# Patient Record
Sex: Female | Born: 1985 | Race: Black or African American | Hispanic: No | Marital: Married | Smoking: Never smoker
Health system: Southern US, Community
[De-identification: ages and names within clinical notes are randomized; demographics above are authoritative.]

## PROBLEM LIST (undated history)

## (undated) DIAGNOSIS — J36 Peritonsillar abscess: Secondary | ICD-10-CM

## (undated) DIAGNOSIS — D649 Anemia, unspecified: Secondary | ICD-10-CM

## (undated) HISTORY — PX: NO PAST SURGERIES: SHX2092

---

## 2018-06-06 ENCOUNTER — Emergency Department (HOSPITAL_COMMUNITY)

## 2018-06-06 ENCOUNTER — Other Ambulatory Visit: Payer: Self-pay

## 2018-06-06 ENCOUNTER — Observation Stay (HOSPITAL_COMMUNITY)
Admission: EM | Admit: 2018-06-06 | Discharge: 2018-06-07 | Disposition: A | Discharge status: Home / Self Care | Attending: Internal Medicine | Admitting: Internal Medicine

## 2018-06-06 ENCOUNTER — Encounter (HOSPITAL_COMMUNITY): Payer: Self-pay | Admitting: Emergency Medicine

## 2018-06-06 ENCOUNTER — Encounter (HOSPITAL_COMMUNITY): Payer: Self-pay | Admitting: General Practice

## 2018-06-06 ENCOUNTER — Ambulatory Visit (INDEPENDENT_AMBULATORY_CARE_PROVIDER_SITE_OTHER)
Admission: EM | Admit: 2018-06-06 | Discharge: 2018-06-06 | Disposition: A | Discharge status: Home / Self Care | Source: Home / Self Care

## 2018-06-06 DIAGNOSIS — J36 Peritonsillar abscess: Principal | ICD-10-CM

## 2018-06-06 DIAGNOSIS — H9209 Otalgia, unspecified ear: Secondary | ICD-10-CM | POA: Insufficient documentation

## 2018-06-06 DIAGNOSIS — J029 Acute pharyngitis, unspecified: Secondary | ICD-10-CM

## 2018-06-06 DIAGNOSIS — Z23 Encounter for immunization: Secondary | ICD-10-CM | POA: Diagnosis not present

## 2018-06-06 DIAGNOSIS — R0981 Nasal congestion: Secondary | ICD-10-CM | POA: Insufficient documentation

## 2018-06-06 DIAGNOSIS — I889 Nonspecific lymphadenitis, unspecified: Secondary | ICD-10-CM | POA: Diagnosis not present

## 2018-06-06 DIAGNOSIS — D509 Iron deficiency anemia, unspecified: Secondary | ICD-10-CM | POA: Insufficient documentation

## 2018-06-06 DIAGNOSIS — D72829 Elevated white blood cell count, unspecified: Secondary | ICD-10-CM

## 2018-06-06 DIAGNOSIS — R509 Fever, unspecified: Secondary | ICD-10-CM | POA: Diagnosis present

## 2018-06-06 HISTORY — DX: Peritonsillar abscess: J36

## 2018-06-06 HISTORY — DX: Anemia, unspecified: D64.9

## 2018-06-06 LAB — MONONUCLEOSIS SCREEN: Mono Screen: NEGATIVE

## 2018-06-06 LAB — COMPREHENSIVE METABOLIC PANEL
ALK PHOS: 81 U/L (ref 38–126)
ALT: 9 U/L (ref 0–44)
ANION GAP: 10 (ref 5–15)
AST: 17 U/L (ref 15–41)
Albumin: 3.7 g/dL (ref 3.5–5.0)
BUN: 6 mg/dL (ref 6–20)
CALCIUM: 9.3 mg/dL (ref 8.9–10.3)
CO2: 24 mmol/L (ref 22–32)
Chloride: 103 mmol/L (ref 98–111)
Creatinine, Ser: 0.51 mg/dL (ref 0.44–1.00)
GFR calc Af Amer: >60 mL/min (ref >60)
GFR calc non Af Amer: >60 mL/min (ref >60)
GLUCOSE: 123 mg/dL — AB (ref 70–99)
Potassium: 3.8 mmol/L (ref 3.5–5.1)
Sodium: 137 mmol/L (ref 135–145)
TOTAL PROTEIN: 8 g/dL (ref 6.5–8.1)
Total Bilirubin: 0.5 mg/dL (ref 0.3–1.2)

## 2018-06-06 LAB — CBC WITH DIFFERENTIAL/PLATELET
Abs Immature Granulocytes: 0.09 10*3/uL — ABNORMAL HIGH (ref 0.00–0.07)
Basophils Absolute: 0 10*3/uL (ref 0.0–0.1)
Basophils Relative: 0 %
EOS ABS: 0 10*3/uL (ref 0.0–0.5)
Eosinophils Relative: 0 %
HCT: 35.8 % — ABNORMAL LOW (ref 36.0–46.0)
Hemoglobin: 11.1 g/dL — ABNORMAL LOW (ref 12.0–15.0)
Immature Granulocytes: 1 %
Lymphocytes Relative: 9 %
Lymphs Abs: 1.8 10*3/uL (ref 0.7–4.0)
MCH: 20.7 pg — AB (ref 26.0–34.0)
MCHC: 31 g/dL (ref 30.0–36.0)
MCV: 66.7 fL — ABNORMAL LOW (ref 80.0–100.0)
Monocytes Absolute: 1.2 10*3/uL — ABNORMAL HIGH (ref 0.1–1.0)
Monocytes Relative: 7 %
Neutro Abs: 15.7 10*3/uL — ABNORMAL HIGH (ref 1.7–7.7)
Neutrophils Relative %: 83 %
Platelets: 221 10*3/uL (ref 150–400)
RBC: 5.37 MIL/uL — ABNORMAL HIGH (ref 3.87–5.11)
RDW: 15.8 % — ABNORMAL HIGH (ref 11.5–15.5)
WBC: 18.9 10*3/uL — ABNORMAL HIGH (ref 4.0–10.5)
nRBC: 0 % (ref 0.0–0.2)

## 2018-06-06 LAB — I-STAT BETA HCG BLOOD, ED (MC, WL, AP ONLY)

## 2018-06-06 LAB — POCT RAPID STREP A: Streptococcus, Group A Screen (Direct): NEGATIVE

## 2018-06-06 LAB — I-STAT CG4 LACTIC ACID, ED
Lactic Acid, Venous: 0.88 mmol/L (ref 0.5–1.9)
Lactic Acid, Venous: 0.71 mmol/L (ref 0.5–1.9)

## 2018-06-06 MED ORDER — KETOROLAC TROMETHAMINE 30 MG/ML IJ SOLN
30.0000 mg | Freq: Once | INTRAMUSCULAR | Status: AC
  Administered 2018-06-06: 30 mg via INTRAVENOUS
  Filled 2018-06-06: qty 1

## 2018-06-06 MED ORDER — CLINDAMYCIN PHOSPHATE 600 MG/50ML IV SOLN
600.0000 mg | Freq: Once | INTRAVENOUS | Status: AC
  Administered 2018-06-06: 600 mg via INTRAVENOUS
  Filled 2018-06-06: qty 50

## 2018-06-06 MED ORDER — ENOXAPARIN SODIUM 40 MG/0.4ML ~~LOC~~ SOLN
40.0000 mg | SUBCUTANEOUS | Status: DC
  Administered 2018-06-06: 40 mg via SUBCUTANEOUS
  Filled 2018-06-06: qty 0.4

## 2018-06-06 MED ORDER — ACETAMINOPHEN 160 MG/5ML PO SOLN
ORAL | Status: AC
  Filled 2018-06-06: qty 20.3

## 2018-06-06 MED ORDER — DEXAMETHASONE SODIUM PHOSPHATE 10 MG/ML IJ SOLN
10.0000 mg | Freq: Three times a day (TID) | INTRAMUSCULAR | Status: AC
  Administered 2018-06-06 – 2018-06-07 (×2): 10 mg via INTRAVENOUS
  Filled 2018-06-06 (×2): qty 1

## 2018-06-06 MED ORDER — IOHEXOL 300 MG/ML  SOLN
75.0000 mL | Freq: Once | INTRAMUSCULAR | Status: AC | PRN
  Administered 2018-06-06: 75 mL via INTRAVENOUS

## 2018-06-06 MED ORDER — ACETAMINOPHEN 325 MG PO TABS
650.0000 mg | ORAL_TABLET | Freq: Once | ORAL | Status: AC
  Administered 2018-06-06: 650 mg via ORAL

## 2018-06-06 MED ORDER — CLINDAMYCIN PHOSPHATE 600 MG/50ML IV SOLN
600.0000 mg | Freq: Three times a day (TID) | INTRAVENOUS | Status: DC
  Administered 2018-06-06 – 2018-06-07 (×2): 600 mg via INTRAVENOUS
  Filled 2018-06-06 (×3): qty 50

## 2018-06-06 MED ORDER — ACETAMINOPHEN 325 MG PO TABS: 650.0000 mg | ORAL_TABLET | Freq: Four times a day (QID) | ORAL | Status: DC | PRN

## 2018-06-06 MED ORDER — SODIUM CHLORIDE 0.9 % IV SOLN
Freq: Once | INTRAVENOUS | Status: AC
  Administered 2018-06-06: 21:00:00 via INTRAVENOUS

## 2018-06-06 MED ORDER — INFLUENZA VAC SPLIT QUAD 0.5 ML IM SUSY
0.5000 mL | PREFILLED_SYRINGE | INTRAMUSCULAR | Status: AC
  Administered 2018-06-07: 0.5 mL via INTRAMUSCULAR
  Filled 2018-06-06: qty 0.5

## 2018-06-06 MED ORDER — DEXAMETHASONE SODIUM PHOSPHATE 10 MG/ML IJ SOLN
10.0000 mg | Freq: Once | INTRAMUSCULAR | Status: AC
  Administered 2018-06-06: 10 mg via INTRAVENOUS
  Filled 2018-06-06: qty 1

## 2018-06-06 MED ORDER — SODIUM CHLORIDE 0.9% FLUSH
3.0000 mL | Freq: Two times a day (BID) | INTRAVENOUS | Status: DC
  Administered 2018-06-06: 3 mL via INTRAVENOUS

## 2018-06-06 MED ORDER — ACETAMINOPHEN 650 MG RE SUPP: 650.0000 mg | Freq: Four times a day (QID) | RECTAL | Status: DC | PRN

## 2018-06-06 MED ORDER — HYDROCODONE-ACETAMINOPHEN 7.5-325 MG/15ML PO SOLN
10.0000 mL | Freq: Once | ORAL | Status: AC
  Administered 2018-06-06: 10 mL via ORAL
  Filled 2018-06-06: qty 15

## 2018-06-06 MED ORDER — SODIUM CHLORIDE 0.9 % IV BOLUS
1000.0000 mL | Freq: Once | INTRAVENOUS | Status: AC
  Administered 2018-06-06: 1000 mL via INTRAVENOUS

## 2018-06-06 NOTE — ED Notes (Signed)
Patient transported to CT 

## 2018-06-06 NOTE — Discharge Instructions (Addendum)
Please go to the ER for further evaluation and rule out peritonsillar abscess.

## 2018-06-06 NOTE — H&P (Signed)
Date: 06/06/2018               Patient Name:  Colleen Shah MRN: [id number]  DOB: 04-01-1986 Age / Sex: 33 y.o., female   PCP: Patient, No Pcp Per         Medical Service: Internal Medicine Teaching Service         Attending Physician: Dr. Gust RungHoffman, Erik C, DO    First Contact: Dr. Chesley MiresBloomfield Pager: [phone]  Second Contact: Dr. Evelene CroonSantos  Pager: [phone]       After Hours (After 5p/  First Contact Pager: [phone]  weekends / holidays): Second Contact Pager: [phone]   Chief Complaint: sore throat   History of Present Illness: Colleen Shah is a 33 y/o otherwise healthy female who presents with 1 week history of sore throat. Also complains of associated fevers, chills, body aches, cough, nasal congestion and ear pain. She began taking OTC cold remedies without much relief. Her sore throat has progressively gotten worse since onset to the point that it has become painful to talk or swallow solid foods. She presented to urgent care today. Rapid strep test was negative, but she was subsequently to ED due to concern for peritonsillar abscess. She denies headaches, shortness of breath, chest pain, abdominal pain, n/v, diarrhea, urinary symptoms or rashes.   Meds: no home meds   Allergies: no known drug allergy   Family History: positive for DM, HTN   Social History: she does not work; no tobacco, EtOH or illicit drug use  Review of Systems: A complete ROS was negative except as per HPI.   Physical Exam: Blood pressure 112/72, pulse 99, temperature 98.3 F (36.8 C), temperature source Oral, resp. rate (!) 23, height 5\' 8"  (1.727 m), weight 81.6 kg, last menstrual period 05/23/2018, SpO2 95 %. General: awake, alert, pleasant female. Non-toxic appearing. NAD HEENT: [facility]/AT; conjunctiva normal; not able to visualize posterior oropharynx Neck: supple; cervical LAD that is TTP  CV: Tachycardic with regular rhythm Pulm: normal respiratory effort; lungs CTA bilaterally Abd: BS+; abdomen is  soft, non-tender, non-distended Skin: warm and diaphoretic Neuro: A&Ox3; answers questions and follows commands appropriately; no focal deficits   EKG: personally reviewed my interpretation is sinus tachycardia; no ST segment changes or T wave inversions    CT neck: tonsillitis with peritonsillar abscesses measuring 9 mm on right and 8 mm on left   Assessment & Plan by Problem: Active Problems:   Peritonsillar abscess  Colleen Shah is a 33 y/o otherwise healthy female presenting with 1 week of progressively worsening sore throat with associated fevers, chills, and body aches. On presentation, she was febrile to 102.7 and tachycardic. Work-up revealed negative strep, leukocytosis of 18.9, microcytic anemia with hgb of 11. CMP and lactic acid were normal. CT neck confirmed bilateral peritonsillar abscesses measuring 8 and 9 mm. She was started on Clindamycin.   1. Bilateral peritonsillar abscesses -  EDP consulted ENT who recommended continuing clindamycin, as well as treating with Decadron 10 mg IV q 8 for 3 doses. Recommended outpatient follow-up, as the lesions are not drainable.  - patient is hemodynamically stable without signs of airway compromise  - pain significantly improved with Toradol and hydrocodone-acetaminophen solution in the ED  - mono screen is pending   2. Microcytic anemia - obtaining iron studies   Diet: soft DVT ppx: Lovenox CODE: FULL   Dispo: Admit patient to Observation with expected length of stay less than 2 midnights.  Signed: Lenward ChancellorBloomfield, Natasha Paulson D, DO  06/06/2018, 6:27 PM  Pager: [phone]

## 2018-06-06 NOTE — ED Notes (Signed)
Admitting MD at bedside.

## 2018-06-06 NOTE — ED Triage Notes (Signed)
One week history of sore throat.

## 2018-06-06 NOTE — ED Provider Notes (Signed)
[facility]    CSN: [id number] Arrival date & time: 06/06/18  6433     History   Chief Complaint Chief Complaint  Patient presents with  . Sore Throat    HPI Colleen Shah is a 33 y.o. female.   Patient is a 33 year old female presents with sore throat for approximately 1 week.  The sore throat has  severely worsened since  last night.  The pain is worse on the left side with swelling.  She is having a lot of trouble swallowing and having high fevers.  She is also having some associated body aches and chills.  She is also having some left ear pain and lymphadenopathy.  Denies any cough, congestion.   ROS per HPI    Sore Throat     History reviewed. No pertinent past medical history.  There are no active problems to display for this patient.   History reviewed. No pertinent surgical history.  OB History   No obstetric history on file.      Home Medications    Prior to Admission medications   Not on File    Family History History reviewed. No pertinent family history.  Social History Social History   Tobacco Use  . Smoking status: Never Smoker  Substance Use Topics  . Alcohol use: Yes  . Drug use: Never     Allergies   Patient has no known allergies.   Review of Systems Review of Systems   Physical Exam Triage Vital Signs ED Triage Vitals  Enc Vitals Group     BP 06/06/18 0927 128/88     Pulse Rate 06/06/18 0927 (!) 121     Resp 06/06/18 0927 18     Temp 06/06/18 0927 (!) 102.7 F (39.3 C)     Temp Source 06/06/18 0927 Oral     SpO2 06/06/18 0927 97 %     Weight --      Height --      Head Circumference --      Peak Flow --      Pain Score 06/06/18 0932 10     Pain Loc --      Pain Edu? --      Excl. in GC? --    No data found.  Updated Vital Signs BP 128/88 (BP Location: Left Arm)   Pulse (!) 121   Temp (!) 102.7 F (39.3 C) (Oral)   Resp 18   LMP 05/23/2018   SpO2 97%   Visual Acuity Right Eye Distance:     Left Eye Distance:   Bilateral Distance:    Right Eye Near:   Left Eye Near:    Bilateral Near:     Physical Exam Vitals signs and nursing note reviewed.  Constitutional:      Appearance: She is ill-appearing.  HENT:     Head: Normocephalic and atraumatic.     Right Ear: Tympanic membrane and ear canal normal.     Left Ear: Tympanic membrane and ear canal normal.     Nose: No congestion or rhinorrhea.     Mouth/Throat:     Pharynx: Pharyngeal swelling present.     Comments: Uvula appears to be slightly deviated Left-sided tonsillar swelling Left cervical adenopathy with tenderness to palpation of left neck area. Muffled voice Very hard to get patient to open her mouth enough to get a full view of the oropharynx.  Eyes:     Conjunctiva/sclera: Conjunctivae normal.  Neck:  Musculoskeletal: Normal range of motion.  Pulmonary:     Effort: Pulmonary effort is normal.  Lymphadenopathy:     Cervical: Cervical adenopathy present.  Skin:    General: Skin is warm.  Neurological:     Mental Status: She is alert.  Psychiatric:        Mood and Affect: Mood normal.      UC Treatments / Results  Labs (all labs ordered are listed, but only abnormal results are displayed) Labs Reviewed  CULTURE, GROUP A STREP [facility])  POCT RAPID STREP A    EKG None  Radiology No results found.  Procedures Procedures (including critical care time)  Medications Ordered in UC Medications  acetaminophen (TYLENOL) tablet 650 mg (650 mg Oral Given 06/06/18 0941)    Initial Impression / Assessment and Plan / UC Course  I have reviewed the triage vital signs and the nursing notes.  Pertinent labs & imaging results that were available during my care of the patient were reviewed by me and considered in my medical decision making (see chart for details).     Sending patient to the ER to rule out peritonsillar abscess Final Clinical Impressions(s) / UC Diagnoses   Final diagnoses:   Sore throat     Discharge Instructions     Please go to the ER for further evaluation and rule out peritonsillar abscess.    ED Prescriptions    None     Controlled Substance Prescriptions [place] Controlled Substance Registry consulted? Not Applicable   Janace Aris, NP 06/06/18 1031

## 2018-06-06 NOTE — Discharge Instructions (Addendum)
Please read and follow all provided instructions.  Your diagnoses today include:  1. Peritonsillar abscess   2. Leukocytosis, unspecified type     Tests performed today include: Strep test: was negative for strep throat Strep culture: you will be notified if this comes back positive. You are treated with antibiotics to cover strep regardless due to  Vital signs. See below for your results today.   Medications prescribed:    Home care instructions:  Please read the educational materials provided and follow any instructions contained in this packet.  Follow-up instructions: Please follow-up with your primary care provider as needed for further evaluation of your symptoms.  Return instructions:  Please return to the Emergency Department if you experience worsening symptoms.  Return if you have worsening problems swallowing, your neck becomes swollen, you cannot swallow your saliva or your voice becomes muffled.  Return with high persistent fever, persistent vomiting, or if you have trouble breathing.  Please return if you have any other emergent concerns.  Additional Information:  Your vital signs today were: BP 111/80 (BP Location: Right Arm)    Pulse (!) 101    Temp 99.6 F (37.6 C) (Oral)    Resp 20    LMP 05/23/2018    SpO2 99%  If your blood pressure (BP) was elevated above 135/85 this visit, please have this repeated by your doctor within one month. --------------

## 2018-06-06 NOTE — ED Provider Notes (Signed)
[facility] EMERGENCY DEPARTMENT Provider Note   CSN: [id number] Arrival date & time: 06/06/18  1032     History   Chief Complaint Chief Complaint  Patient presents with  . Sore Throat    HPI Colleen Shah is a 33 y.o. female.  HPI  Patient is a 33 year old female with no significant past medical history presenting for sore throat and sensation of throat swelling.  She presents from urgent care who recommended ED evaluation for possible peritonsillar abscess.  Patient reports that she has had a sore throat for approximately 1 week, however began to worsen for the past couple days.  She reports that she believes she has a history of a peritonsillar abscess on the same side, however it spontaneously drained after her mother "massaged" it.  She reports she began feeling febrile yesterday.  She reports she has had difficulty keeping down p.o., and was snoring last night per her husband, but otherwise denies difficulty breathing.  She does report muffled voice.  No past medical history on file.  There are no active problems to display for this patient.   No past surgical history on file.   OB History   No obstetric history on file.      Home Medications    Prior to Admission medications   Not on File    Family History No family history on file.  Social History Social History   Tobacco Use  . Smoking status: Never Smoker  Substance Use Topics  . Alcohol use: Yes  . Drug use: Never     Allergies   Patient has no known allergies.   Review of Systems Review of Systems  Constitutional: Positive for chills and fever.  HENT: Positive for sore throat, trouble swallowing and voice change. Negative for congestion.   Eyes: Negative for visual disturbance.  Respiratory: Negative for cough, chest tightness, shortness of breath, wheezing and stridor.   Cardiovascular: Negative for chest pain.  Gastrointestinal: Negative for abdominal pain, nausea and  vomiting.  Genitourinary: Negative for dysuria and flank pain.  Musculoskeletal: Negative for back pain and myalgias.  Skin: Negative for rash.  Neurological: Negative for dizziness, syncope, light-headedness and headaches.     Physical Exam Updated Vital Signs BP 121/76   Pulse (!) 101   Temp 99.6 F (37.6 C) (Oral)   Resp 16   LMP 05/23/2018   SpO2 99%   Physical Exam Vitals signs and nursing note reviewed.  Constitutional:      General: She is not in acute distress.    Appearance: She is well-developed.  HENT:     Head: Normocephalic and atraumatic.     Mouth/Throat:     Mouth: Mucous membranes are moist.     Tonsils: Tonsillar abscess present. Swelling: 3+ on the right. 3+ on the left.     Comments: Uvular deviation to the right.  Eyes:     Conjunctiva/sclera: Conjunctivae normal.     Pupils: Pupils are equal, round, and reactive to light.  Neck:     Musculoskeletal: Normal range of motion and neck supple.     Comments: Left sided cervical adenopathy.  Cardiovascular:     Rate and Rhythm: Regular rhythm. Tachycardia present.     Heart sounds: S1 normal and S2 normal. No murmur.  Pulmonary:     Effort: Pulmonary effort is normal.     Breath sounds: Normal breath sounds. No wheezing or rales.  Abdominal:     General: There is no distension.  Palpations: Abdomen is soft.     Tenderness: There is no abdominal tenderness. There is no guarding.  Musculoskeletal: Normal range of motion.        General: No deformity.  Lymphadenopathy:     Cervical: Cervical adenopathy present.  Skin:    General: Skin is warm and dry.     Findings: No erythema or rash.  Neurological:     Mental Status: She is alert.     Comments: Cranial nerves grossly intact. Patient moves extremities symmetrically and with good coordination.  Psychiatric:        Behavior: Behavior normal.        Thought Content: Thought content normal.        Judgment: Judgment normal.      ED  Treatments / Results  Labs (all labs ordered are listed, but only abnormal results are displayed) Labs Reviewed  COMPREHENSIVE METABOLIC PANEL - Abnormal; Notable for the following components:      Result Value   Glucose, Bld 123 (*)    All other components within normal limits  CBC WITH DIFFERENTIAL/PLATELET - Abnormal; Notable for the following components:   WBC 18.9 (*)    RBC 5.37 (*)    Hemoglobin 11.1 (*)    HCT 35.8 (*)    MCV 66.7 (*)    MCH 20.7 (*)    RDW 15.8 (*)    Neutro Abs 15.7 (*)    Monocytes Absolute 1.2 (*)    Abs Immature Granulocytes 0.09 (*)    All other components within normal limits  CULTURE, BLOOD (ROUTINE X 2)  CULTURE, BLOOD (ROUTINE X 2)  MONONUCLEOSIS SCREEN  I-STAT CG4 LACTIC ACID, ED  I-STAT BETA HCG BLOOD, ED ([facility], [facility], [facility] ONLY)  I-STAT CG4 LACTIC ACID, ED    EKG EKG Interpretation  Date/Time:  Wednesday June 06 2018 10:51:15 EST Ventricular Rate:  124 PR Interval:  142 QRS Duration: 72 QT Interval:  312 QTC Calculation: 448 R Axis:   85 Text Interpretation:  Sinus tachycardia Otherwise normal ECG No old tracing to compare Confirmed by Shaune PollackIsaacs, Cameron [phone]([id number]) on 06/06/2018 4:28:41 PM   Radiology Ct Soft Tissue Neck W Contrast  Result Date: 06/06/2018 CLINICAL DATA:  Sore throat and anterior neck pain with fever EXAM: CT NECK WITH CONTRAST TECHNIQUE: Multidetector CT imaging of the neck was performed using the standard protocol following the bolus administration of intravenous contrast. CONTRAST:  75mL OMNIPAQUE IOHEXOL 300 MG/ML  SOLN COMPARISON:  None. FINDINGS: Pharynx and larynx: Thickened tonsils with bilateral adenoid rim enhancing collections measuring 9 mm on the right and 8 mm on the left. There is a prominent degree of retropharyngeal submucosal edema that tracks into the supraglottic larynx. There is retropharyngeal low-density that has some mass effect on the posterior pharyngeal wall and is somewhat discrete. No rim enhancing  retropharyngeal collection. Salivary glands: No inflammation, mass, or stone. Thyroid: Normal. Lymph nodes: Enlarged bilateral jugular lymph nodes without cavitation. Vascular: Negative Limited intracranial: Negative Visualized orbits: Negative Mastoids and visualized paranasal sinuses: Clear Skeleton: No evidence spinal infection. Upper chest: Negative IMPRESSION: 1. Tonsillitis with peritonsillar abscesses measuring 9 mm on the right and 8 mm on the left. Associated submucosal edema continues into the supraglottic larynx. Retropharyngeal edema versus phlegmon. 2. Cervical adenitis Electronically Signed   By: Marnee SpringJonathon  Watts M.D.   On: 06/06/2018 15:30    Procedures Procedures (including critical care time)  Medications Ordered in ED Medications  sodium chloride 0.9 % bolus 1,000 mL (0 mLs  Intravenous Stopped 06/06/18 1412)  ketorolac (TORADOL) 30 MG/ML injection 30 mg (30 mg Intravenous Given 06/06/18 1316)  clindamycin (CLEOCIN) IVPB 600 mg (0 mg Intravenous Stopped 06/06/18 1411)  dexamethasone (DECADRON) injection 10 mg (10 mg Intravenous Given 06/06/18 1316)  HYDROcodone-acetaminophen (HYCET) 7.5-325 mg/15 ml solution 10 mL (10 mLs Oral Given 06/06/18 1434)     Initial Impression / Assessment and Plan / ED Course  I have reviewed the triage vital signs and the nursing notes.  Pertinent labs & imaging results that were available during my care of the patient were reviewed by me and considered in my medical decision making (see chart for details).  Clinical Course as of Jun 06 1817  Wed Jun 06, 2018  1137 WBC(!): 18.9 [AM]  1137 Lactic Acid, Venous: 0.88 [AM]  1413 Hcg negative. Not crossing over.   I-Stat Beta hCG blood, ED ([facility], [facility], [facility] only) [AM]  1609 Pt with bilateral PTAs. Will consult ENT.    [AM]  1611 Spoke with Dr. Doran HeaterMarcellino of ENT who states that patient's PTA sizes are not drainable at this time.  Appreciate her involvement in the care of this patient.    [AM]  1642 Improving.    Pulse Rate: 99 [AM]  1734 Dr. Doran HeaterMarcellino recommends decadron 10 mg every 8 hours for 3 total doses. Recommends continuing Clindamycin.    [AM]    Clinical Course User Index [AM] Elisha PonderMurray, Alyssa B, PA-C    Patient is critically ill and requiring a higher level of care. Sepsis suspected. Code sepsis called. Patient meeting SIRS criteria based on fever, tachycardia, and leukocytosis. See vitals below. Suspected source of infection tonsillar based on exam. Lactic acid normal.   CT scan demonstrating bilateral peritonsillar abscesses, right 9 mm and left 8 mm.  Patient also with possible early developing phlegmon.  Patient with retropharyngeal edema.  Skin was discussed with Dr. Doran HeaterMarcellino of ENT.  She recommends if patient is admitted, continue clindamycin, Decadron 10 mg IV every 8 hours x3 doses, and reconsultation of ENT if any further concerns arise or patient has airway concerns.  Otherwise, patient to follow-up as outpatient, as these lesions are not drainable.  Recommends considering mononucleosis screen. Appreciate her vomit in the care of this patient.  Vitals:   06/06/18 1500 06/06/18 1515 06/06/18 1530 06/06/18 1628  BP: 122/78 113/79 111/80 109/80  Pulse: (!) 103 (!) 105 (!) 101 99  Resp: (!) 24 (!) 21 20 18   Temp:    98.3 F (36.8 C)  TempSrc:    Oral  SpO2: 100% 98% 99% 97%     Broad spectrum antibiotics initiated based on suspected source of infection. No hypotension or lactate elevation requiring 30 cc/kilo bolus.   Sepsis - Repeat Assessment  Performed at:    4:47 PM  Vitals     Blood pressure 109/80, pulse 99, temperature 98.3 F (36.8 C), temperature source Oral, resp. rate 18, last menstrual period 05/23/2018, SpO2 97 %.  Heart:     Regular rate and rhythm  Lungs:    CTA  Capillary Refill:   <2 sec  Peripheral Pulse:   Radial pulse palpable  Skin:     Normal Color   Spoke with Dr. Evelene CroonSantos of internal medicine who has decided to admit patient. Appreciate her  involvement in the care of this patient.   Final Clinical Impressions(s) / ED Diagnoses   Final diagnoses:  Peritonsillar abscess  Leukocytosis, unspecified type    ED Discharge Orders  None       Delia Chimes 06/06/18 1821    Shaune Pollack, MD 06/07/18 [phone]

## 2018-06-06 NOTE — ED Triage Notes (Signed)
Pt here from UC  [facility] possible [facility]il abscess[facility]

## 2018-06-06 NOTE — ED Notes (Signed)
  Attempted to call report.  RN will call me back. 

## 2018-06-07 ENCOUNTER — Other Ambulatory Visit: Payer: Self-pay | Admitting: Internal Medicine

## 2018-06-07 LAB — CBC
HCT: 33.3 % — ABNORMAL LOW (ref 36.0–46.0)
Hemoglobin: 10.3 g/dL — ABNORMAL LOW (ref 12.0–15.0)
MCH: 20.5 pg — ABNORMAL LOW (ref 26.0–34.0)
MCHC: 30.9 g/dL (ref 30.0–36.0)
MCV: 66.3 fL — ABNORMAL LOW (ref 80.0–100.0)
Platelets: 207 10*3/uL (ref 150–400)
RBC: 5.02 MIL/uL (ref 3.87–5.11)
RDW: 15.7 % — ABNORMAL HIGH (ref 11.5–15.5)
WBC: 13.6 10*3/uL — ABNORMAL HIGH (ref 4.0–10.5)
nRBC: 0 % (ref 0.0–0.2)

## 2018-06-07 LAB — FERRITIN: Ferritin: 165 ng/mL (ref 11–307)

## 2018-06-07 LAB — IRON AND TIBC
Iron: 26 ug/dL — ABNORMAL LOW (ref 28–170)
Saturation Ratios: 13 % (ref 10.4–31.8)
TIBC: 202 ug/dL — ABNORMAL LOW (ref 250–450)
UIBC: 176 ug/dL

## 2018-06-07 LAB — HIV ANTIBODY (ROUTINE TESTING W REFLEX): HIV Screen 4th Generation wRfx: NONREACTIVE

## 2018-06-07 MED ORDER — CLINDAMYCIN HCL 300 MG PO CAPS: 300.0000 mg | ORAL_CAPSULE | Freq: Four times a day (QID) | ORAL | 0 refills | Status: AC

## 2018-06-07 MED ORDER — FERROUS SULFATE 325 (65 FE) MG PO TABS: 325.0000 mg | ORAL_TABLET | Freq: Every day | ORAL | 3 refills | Status: DC

## 2018-06-07 MED ORDER — CLINDAMYCIN HCL 300 MG PO CAPS: 300.0000 mg | ORAL_CAPSULE | Freq: Four times a day (QID) | ORAL | Status: DC

## 2018-06-07 NOTE — Progress Notes (Signed)
Discharge instructions reviewed with patient and patient's husband. Able to answer teach back questions. Verbalizes understanding. Plan to pick up prescription from pharmacy.Denies any additional questions. Printed AVS given to patient. Patient discharged to home Accompanied by husband.

## 2018-06-07 NOTE — Progress Notes (Signed)
   Subjective: Colleen Shah was seen and evaluated at bedside on morning rounds. No acute events overnight. She is feeling much better today. She was able to eat a sandwich last night and breakfast this morning without any difficulty with swallowing. No fevers, chills, or shortness of breath.   Objective:  Vital signs in last 24 hours: Vitals:   06/06/18 1905 06/06/18 2053 06/07/18 0528 06/07/18 1344  BP: 112/65 112/79 130/88 133/86  Pulse: 94 93 97 (!) 101  Resp: 18 15 13 17   Temp:  98.5 F (36.9 C) 98.2 F (36.8 C)   TempSrc:  Oral Oral   SpO2: 99% 99% 100% 97%  Weight:      Height:       General: awake, alert, sitting up in bed in NAD HEENT: posterior oropharynx with erythema and edema, R>L CV: RRR Pulm: normal effort; lungs CTA bilaterally   Assessment/Plan:  Active Problems:   Peritonsillar abscess  Ms. Bledsoe is a 33 y/o otherwise healthy female presenting with 1 week of progressively worsening sore throat with associated fevers, chills, and body aches. On presentation, she was febrile to 102.7 and tachycardic. Work-up revealed negative strep, leukocytosis of 18.9, microcytic anemia with hgb of 11. CMP and lactic acid were normal. CT neck confirmed bilateral peritonsillar abscesses measuring 8 and 9 mm. She was started on Clindamycin.   1. Bilateral peritonsillar abscesses -  endorses significant symptomatic improvement and has remained afebrile - she received 3 doses of Decadron per ENT recommendations - Will continue on Clindamycin 300 mg q 6 to complete a 10 day course - has follow-up scheduled with ENT on 06/11/18  - she was negative for mono  2. Microcytic anemia - labs reveal mild iron deficiency; have sent in iron supplementation for her to take and will recommend follow-up labs with PCP in a few weeks    Dispo: Patient is stable for discharge home today.   Lenward Chancellor D, DO 06/07/2018, 3:08 PM Pager: [phone]

## 2018-06-08 LAB — CULTURE, GROUP A STREP (THRC)

## 2018-06-08 NOTE — Discharge Summary (Signed)
Name: Colleen Shah MRN: [id number] DOB: Oct 07, 1985 33 y.o. PCP: Patient, No Pcp Per  Date of Admission: 06/06/2018 11:00 AM Date of Discharge: 06/07/2018 Attending Physician: Gust Rung, DO  Discharge Diagnosis: 1. Bilateral peritonsillar abscesses   Discharge Medications: Allergies as of 06/07/2018   No Known Allergies     Medication List    TAKE these medications   clindamycin 300 MG capsule Commonly known as:  CLEOCIN Take 1 capsule (300 mg total) by mouth every 6 (six) hours for 9 days.       Disposition and follow-up:   Colleen Shah was discharged from [facility] in Good condition.  At the hospital follow up visit please address:  1.  Bilateral peritonsillar abscesses: She was discharged on Clindamycin 300 mg q 6 to complete a 10 day course. Please ensure she has seen ENT and her symptoms have resolved.  IDA: found to have iron deficiency anemia. Please ensure she is tolerating daily iron supplementation and recheck CBC in a few weeks.   2.  Labs / imaging needed at time of follow-up: none  3.  Pending labs/ test needing follow-up: none   Follow-up Appointments: Follow-up Information    Graylin Shiver, MD. Go on 06/11/2018.   Specialty:  Otolaryngology Why:  at 10:20 Contact information: [address] [place] [address] [phone]           Hospital Course by problem list: Colleen Shah is a 33 y/o otherwise healthy female presenting with 1 week of progressively worsening sore throat with associated fevers, chills, and body aches. On presentation, she was febrile to 102.7 and tachycardic. Work-up revealed negative strep, leukocytosis of 18.9, microcytic anemia with hgb of 11. CMP and lactic acid were normal. CT neck confirmed bilateral peritonsillar abscesses measuring 8 and 9 mm. She was started on Clindamycin.   1. Bilateral peritonsillar abscesses: She had significant symptomatic improvement by the  following day. She received 3 doses of Decadron per ENT recommendations. She was negative for mono. She was transitioned to oral Clindamycin 300 mg q 6 hours to complete a 10 day course. Follow-up scheduled with ENT for 1/6.   2. Microcytic anemia: patient's labs consistent with mild iron deficiency. She was started on daily iron supplementation and should follow-up with PCP in a few weeks.   Discharge Vitals:   BP 133/86 (BP Location: Right Arm)   Pulse (!) 101   Temp 98.2 F (36.8 C) (Oral)   Resp 17   Ht 5\' 8"  (1.727 m)   Wt 81.6 kg   LMP 05/23/2018   SpO2 97%   BMI 27.37 kg/m   Pertinent Labs, Studies, and Procedures:  CT soft tissue neck:  IMPRESSION: 1. Tonsillitis with peritonsillar abscesses measuring 9 mm on the right and 8 mm on the left. Associated submucosal edema continues into the supraglottic larynx. Retropharyngeal edema versus phlegmon. 2. Cervical adenitis  Discharge Instructions: Discharge Instructions    Call MD for:  difficulty breathing, headache or visual disturbances   Complete by:  As directed    Call MD for:  severe uncontrolled pain   Complete by:  As directed    Call MD for:  temperature >100.4   Complete by:  As directed    Diet - low sodium heart healthy   Complete by:  As directed    Discharge instructions   Complete by:  As directed    Colleen Shah, it was a pleasure taking care of you.  I am glad you are feeling better!  Please continue taking the antibiotics 4x daily for the next 9 days so you will get a total of 10 days.  I made you an appointment with the ear nose and throat doctors next Monday (06/07/18) at 10:20.  Please call sooner if you develop any difficulty breathing or increased difficulty swallowing.  Take care!  Dr. Chesley MiresBloomfield   Increase activity slowly   Complete by:  As directed       Signed: Bridget HartshornBloomfield, Ardian Haberland D, DO 06/13/2018, 9:31 PM   Pager: [phone]

## 2018-06-11 LAB — CULTURE, BLOOD (ROUTINE X 2)
Culture: NO GROWTH
Culture: NO GROWTH
SPECIAL REQUESTS: ADEQUATE
Special Requests: ADEQUATE

## 2018-07-05 ENCOUNTER — Other Ambulatory Visit: Payer: Self-pay

## 2018-07-05 ENCOUNTER — Ambulatory Visit (HOSPITAL_COMMUNITY)
Admission: EM | Admit: 2018-07-05 | Discharge: 2018-07-05 | Disposition: A | Discharge status: Home / Self Care | Attending: Family Medicine | Admitting: Family Medicine

## 2018-07-05 ENCOUNTER — Encounter (HOSPITAL_COMMUNITY): Payer: Self-pay

## 2018-07-05 ENCOUNTER — Telehealth (HOSPITAL_COMMUNITY): Payer: Self-pay | Admitting: Emergency Medicine

## 2018-07-05 DIAGNOSIS — J039 Acute tonsillitis, unspecified: Secondary | ICD-10-CM | POA: Diagnosis not present

## 2018-07-05 MED ORDER — AMOXICILLIN-POT CLAVULANATE ER 1000-62.5 MG PO TB12: 1.0000 | ORAL_TABLET | Freq: Two times a day (BID) | ORAL | 0 refills | Status: AC

## 2018-07-05 MED ORDER — PREDNISONE 10 MG (21) PO TBPK: ORAL_TABLET | Freq: Every day | ORAL | 0 refills | Status: AC

## 2018-07-05 NOTE — ED Triage Notes (Signed)
Pt cc sore throat ,chills and body aches. Pt states she's having her tonsils removed in March. She has been using Mucinex for the body aches.

## 2018-07-05 NOTE — Discharge Instructions (Addendum)
Recommend start Augmentin 1000mg  - take 1 tablet twice a day as directed. Start Prednisone (steroids)- 10mg  tablets- take 6 tablets today and then decrease by 1 tablet each day until finished on day 6. Recommend call your ENT today to discuss symptoms and treatment and possibility of worsening of symptoms and need for another drainage of tonsillar abscess. Follow-up with your ENT as planned.

## 2018-07-05 NOTE — Telephone Encounter (Signed)
Pharmacy called, patient needing medications changed.  Generic augmentin from ER to 875mg  and medrol dose pack to to separate tabs due to insurance.  Randall HissAnn Amyot agreeable to both requests and notified pharmacy

## 2018-07-05 NOTE — ED Provider Notes (Signed)
[facility]    CSN: [id number] Arrival date & time: 07/05/18  4098     History   Chief Complaint Chief Complaint  Patient presents with  . Sore Throat    HPI Colleen Shah is a 33 y.o. female.   33 year old female presents with sorethroat, chills and body aches that started yesterday. Also having a fever of around 100. Minimal nasal congestion and denies any cough or GI symptoms. Was seen here at Urgent Care about 4 weeks ago for left peritonsillar abscess that we sent to [facility] ER for drainage. Patient stayed overnight and was given Decadron and placed on Clindamycin for 10 days. She followed up with an ENT on 06/11/18 and a Tonsillectomy is scheduled for March. Today, her symptoms are similar to previous episode but not as severe yet. She has taken Aleve and Mucinex with minimal relief. No other chronic health issues except for the recurrent Tonsillitis and abscesses. Takes no daily medication.   The history is provided by the patient.    Past Medical History:  Diagnosis Date  . Anemia    "years ago" (06/06/2018)  . Peritonsillar abscess 06/06/2018   bilateral/notes 06/06/2018    Patient Active Problem List   Diagnosis Date Noted  . Peritonsillar abscess 06/06/2018    Past Surgical History:  Procedure Laterality Date  . NO PAST SURGERIES      OB History   No obstetric history on file.      Home Medications    Prior to Admission medications   Medication Sig Start Date End Date Taking? Authorizing Provider  amoxicillin-clavulanate (AUGMENTIN XR) 1000-62.5 MG 12 hr tablet Take 1 tablet by mouth 2 (two) times daily for 10 days. 07/05/18 07/15/18  Sudie Grumbling, NP  predniSONE (STERAPRED UNI-PAK 21 TAB) 10 MG (21) TBPK tablet Take by mouth daily. Take 6 tablets today, then decrease by 1 tablet each day until finished. 07/05/18   Sudie Grumbling, NP    Family History History reviewed. No pertinent family history.  Social History Social History    Tobacco Use  . Smoking status: Never Smoker  . Smokeless tobacco: Never Used  Substance Use Topics  . Alcohol use: Yes    Alcohol/week: 2.0 standard drinks    Types: 2 Glasses of wine per week  . Drug use: Never     Allergies   Patient has no known allergies.   Review of Systems Review of Systems  Constitutional: Positive for appetite change, chills, fatigue and fever. Negative for activity change and diaphoresis.  HENT: Positive for congestion (minimal nasal ), postnasal drip and sore throat. Negative for ear discharge, ear pain, facial swelling, mouth sores, nosebleeds, rhinorrhea, sinus pressure, sinus pain, sneezing and trouble swallowing.   Eyes: Negative for pain, discharge, redness and itching.  Respiratory: Negative for cough, chest tightness, shortness of breath and wheezing.   Gastrointestinal: Negative for abdominal pain, diarrhea, nausea and vomiting.  Musculoskeletal: Negative for arthralgias, myalgias, neck pain and neck stiffness.  Skin: Negative for color change, rash and wound.  Allergic/Immunologic: Negative for immunocompromised state.  Neurological: Positive for headaches. Negative for dizziness, tremors, seizures, syncope, weakness, light-headedness and numbness.  Hematological: Positive for adenopathy. Does not bruise/bleed easily.  Psychiatric/Behavioral: Negative.      Physical Exam Triage Vital Signs ED Triage Vitals  Enc Vitals Group     BP 07/05/18 1033 110/71     Pulse Rate 07/05/18 1033 (!) 101     Resp 07/05/18 1033 18  Temp 07/05/18 1033 99.9 F (37.7 C)     Temp Source 07/05/18 1033 Oral     SpO2 07/05/18 1033 100 %     Weight 07/05/18 1031 178 lb (80.7 kg)     Height --      Head Circumference --      Peak Flow --      Pain Score 07/05/18 1031 5     Pain Loc --      Pain Edu? --      Excl. in GC? --    No data found.  Updated Vital Signs BP 110/71 (BP Location: Right Arm)   Pulse (!) 101   Temp 99.9 F (37.7 C) (Oral)    Resp 18   Wt 178 lb (80.7 kg)   LMP 06/28/2018   SpO2 100%   BMI 27.06 kg/m   Visual Acuity Right Eye Distance:   Left Eye Distance:   Bilateral Distance:    Right Eye Near:   Left Eye Near:    Bilateral Near:     Physical Exam Vitals signs and nursing note reviewed.  Constitutional:      General: She is awake. She is not in acute distress.    Appearance: Normal appearance. She is well-developed, well-groomed and normal weight. She is ill-appearing.     Comments: Patient sitting comfortably on exam table in no acute distress but appears ill.   HENT:     Head: Normocephalic and atraumatic.     Right Ear: Hearing, tympanic membrane, ear canal and external ear normal.     Left Ear: Hearing, tympanic membrane, ear canal and external ear normal.     Nose: Nose normal.     Right Sinus: No maxillary sinus tenderness or frontal sinus tenderness.     Left Sinus: No maxillary sinus tenderness or frontal sinus tenderness.     Mouth/Throat:     Lips: Pink.     Mouth: Mucous membranes are moist.     Pharynx: Uvula midline. Pharyngeal swelling, oropharyngeal exudate (slight white to yellow) and posterior oropharyngeal erythema present. No uvula swelling.     Tonsils: Tonsillar exudate (white to yellow mainly on left tonsil) present. No tonsillar abscesses. Swelling: 3+ on the right. 4+ on the left.     Comments: No distinct peritonsillar abscess seen but left tonsil is very swollen.  Eyes:     Extraocular Movements: Extraocular movements intact.     Conjunctiva/sclera: Conjunctivae normal.  Neck:     Musculoskeletal: Normal range of motion and neck supple. Muscular tenderness present.  Cardiovascular:     Rate and Rhythm: Regular rhythm. Tachycardia present.     Pulses: Normal pulses.     Heart sounds: Normal heart sounds. No murmur.  Pulmonary:     Effort: Pulmonary effort is normal. No respiratory distress.     Breath sounds: Normal breath sounds and air entry. No decreased breath  sounds, wheezing, rhonchi or rales.  Musculoskeletal: Normal range of motion.  Lymphadenopathy:     Head:     Right side of head: No tonsillar adenopathy.     Left side of head: Tonsillar adenopathy present.     Cervical: Cervical adenopathy present.     Right cervical: Superficial cervical adenopathy present.     Left cervical: Superficial cervical adenopathy present.  Skin:    General: Skin is warm and dry.     Capillary Refill: Capillary refill takes less than 2 seconds.     Findings: No rash.  Neurological:  General: No focal deficit present.     Mental Status: She is alert and oriented to person, place, and time.  Psychiatric:        Mood and Affect: Mood normal.        Behavior: Behavior normal. Behavior is cooperative.        Thought Content: Thought content normal.        Judgment: Judgment normal.      UC Treatments / Results  Labs (all labs ordered are listed, but only abnormal results are displayed) Labs Reviewed - No data to display  EKG None  Radiology No results found.  Procedures Procedures (including critical care time)  Medications Ordered in UC Medications - No data to display  Initial Impression / Assessment and Plan / UC Course  I have reviewed the triage vital signs and the nursing notes.  Pertinent labs & imaging results that were available during my care of the patient were reviewed by me and considered in my medical decision making (see chart for details).    Reviewed with patient that she appears to have another episode of Tonsillitis. However, no distinct tonsillar abscess seen at this time. Discussed that since she just finished Clindamycin 3 weeks ago- will trial Augmentin XR 1000mg  twice a day as directed. Restart steroids- Prednisone 10mg  6 day dose pack as directed. Discussed that rapid strep test not performed since patient has had varied results in the past and we will treat regardless of result due to history and clinical findings.  Recommend contact her ENT today to discuss symptoms and current treatment. If abscess develops again- will need I & D. Follow-up with her ENT as planned or go to the ER if symptoms worsen.   Addendum- pharmacy called at 12:05pm and unable to get Augmentin 1000mg  XR in any of the pharmacy locations. Would prefer 1000mg XR but will switch to Augmentin 875mg  twice a day as directed. Follow-up with ENT as planned.  Final Clinical Impressions(s) / UC Diagnoses   Final diagnoses:  Tonsillitis     Discharge Instructions     Recommend start Augmentin 1000mg  - take 1 tablet twice a day as directed. Start Prednisone (steroids)- 10mg  tablets- take 6 tablets today and then decrease by 1 tablet each day until finished on day 6. Recommend call your ENT today to discuss symptoms and treatment and possibility of worsening of symptoms and need for another drainage of tonsillar abscess. Follow-up with your ENT as planned.     ED Prescriptions    Medication Sig Dispense Auth. Provider   amoxicillin-clavulanate (AUGMENTIN XR) 1000-62.5 MG 12 hr tablet Take 1 tablet by mouth 2 (two) times daily for 10 days. 20 tablet Sudie GrumblingAmyot, [place] Ursin Berry, NP   predniSONE (STERAPRED UNI-PAK 21 TAB) 10 MG (21) TBPK tablet Take by mouth daily. Take 6 tablets today, then decrease by 1 tablet each day until finished. 21 tablet Sudie GrumblingAmyot, [place] Valeriano Berry, NP     Controlled Substance Prescriptions [place] Controlled Substance Registry consulted? Not Applicable   Sudie Grumblingmyot, Jette Lewan Berry, NP 07/05/18 2142

## 2018-07-25 ENCOUNTER — Other Ambulatory Visit: Payer: Self-pay | Admitting: Otolaryngology

## 2019-08-21 IMAGING — CT CT NECK W/ CM
3 of 5 series · 12 of 33 positions shown, 14 images · IV contrast (Omni 300)
Comparison: None.

CLINICAL DATA: Sore throat and anterior neck pain with fever

EXAM:
CT NECK WITH CONTRAST
TECHNIQUE: Multidetector CT imaging of the neck was performed using the
standard protocol following the bolus administration of intravenous
contrast.
CONTRAST:  75mL OMNIPAQUE IOHEXOL 300 MG/ML  SOLN

[Series 6: neck 2.0 st · sagittal · 0.37mm/px · 5 of 101 slices shown, 6 images (1 of 2)]
[im 34/101  bone]
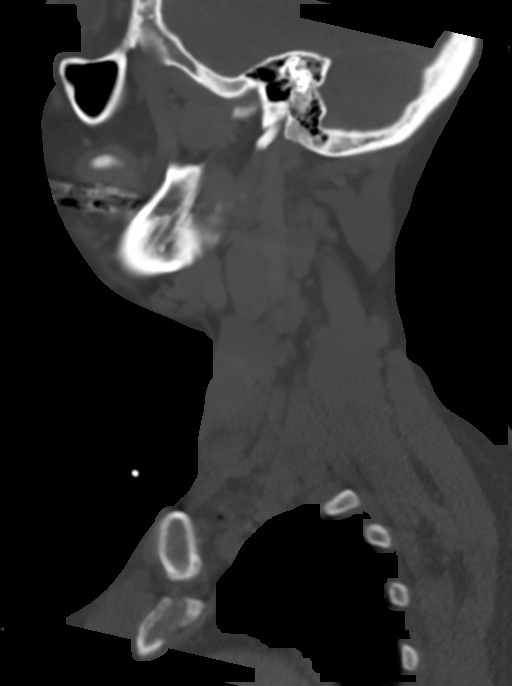
[im 42/101  bone]
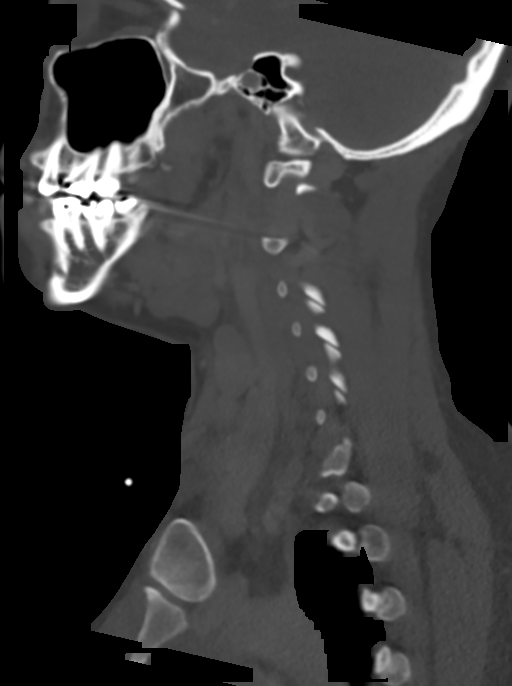
[im 51/101  soft-tissue]
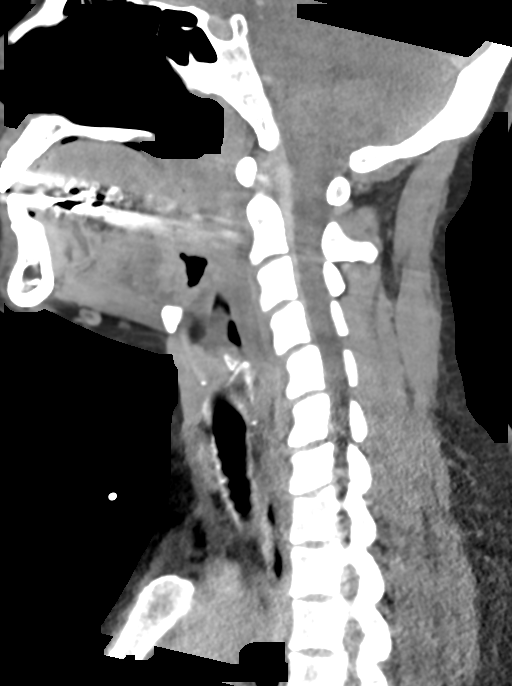
[im 51/101  bone]
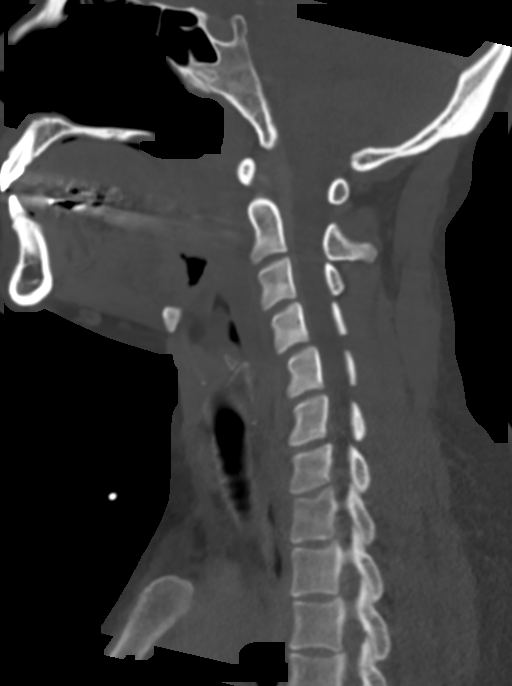
[im 59/101  bone]
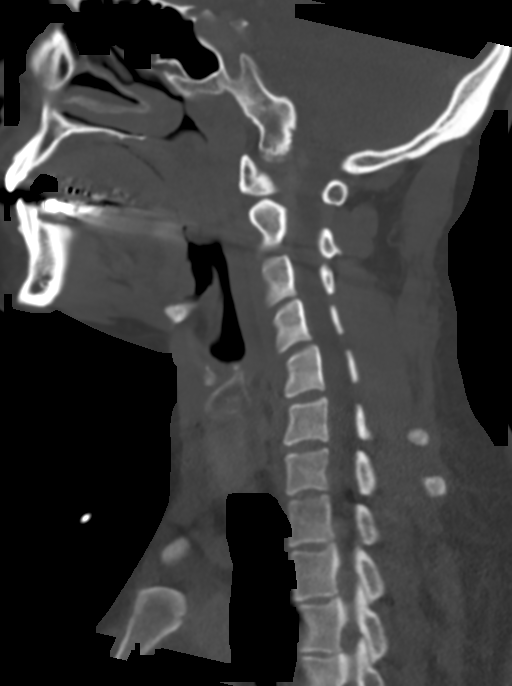
[im 67/101  bone]
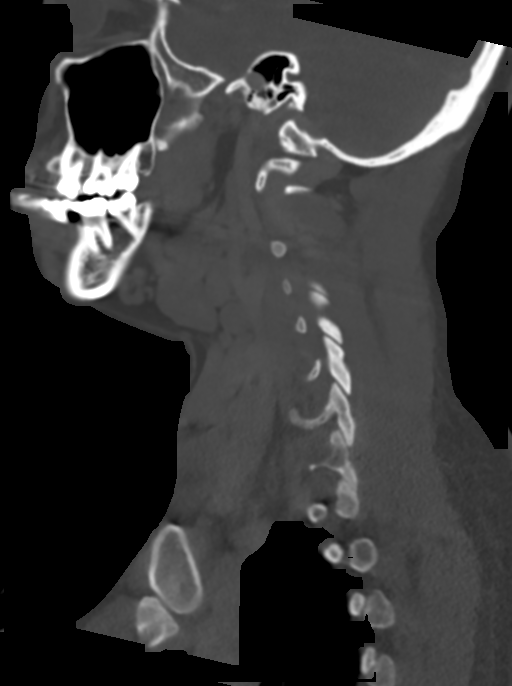

[Series 7: neck 2.0 st · coronal · 0.30mm/px · 3 of 94 slices shown (2 of 2)]
[im 19/94  bone]
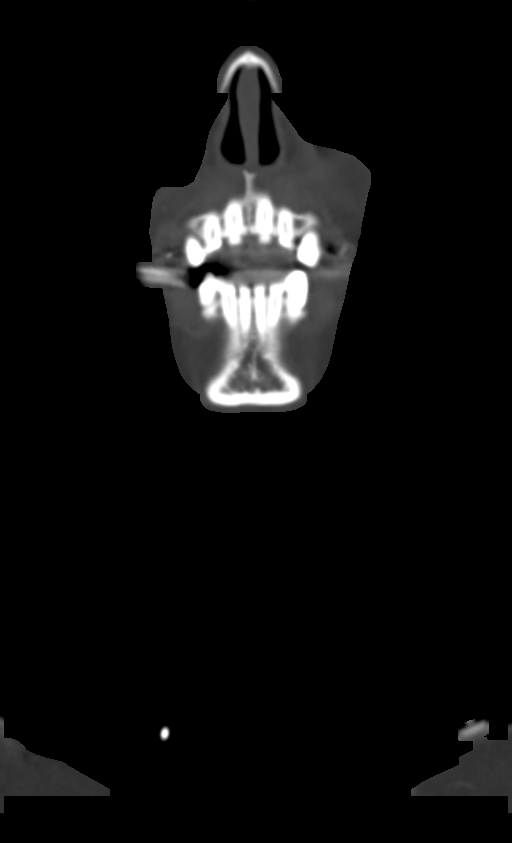
[im 38/94  bone]
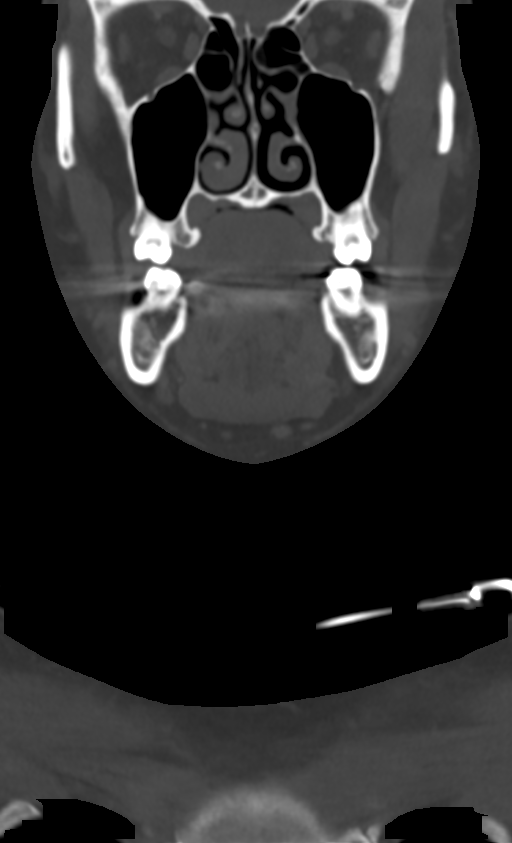
[im 56/94  bone]
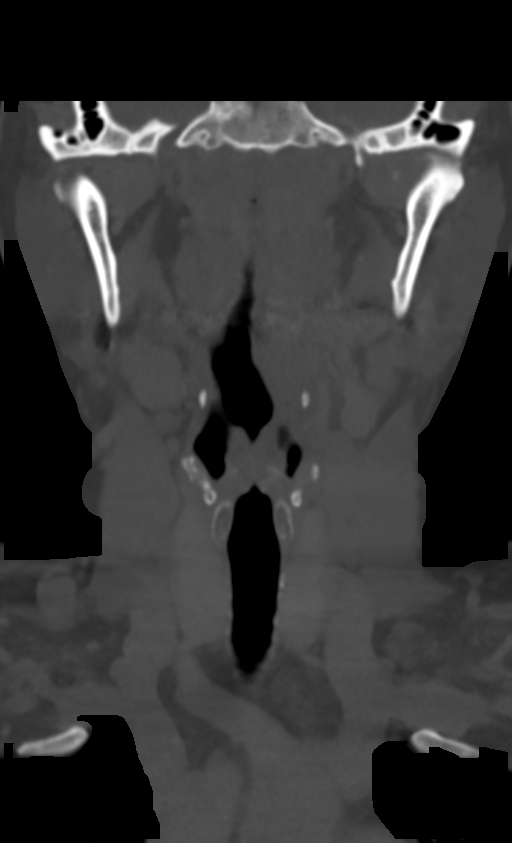

[Series 8: neck 2.0 st orthogonal · axial · 0.39mm/px · z∈[-280,-132]mm · 4 of 125 slices shown, 5 images]
[im 25/125  soft-tissue]
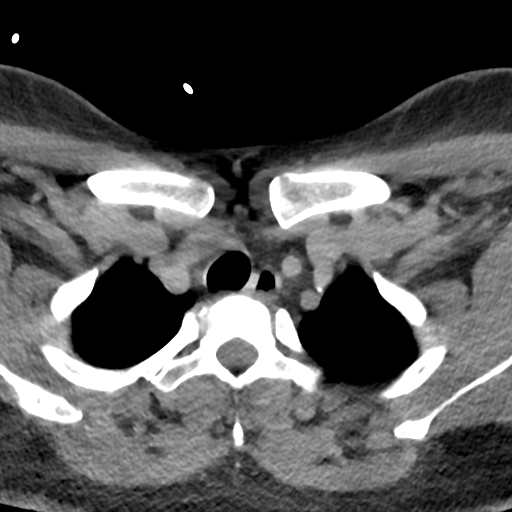
[im 25/125  bone]
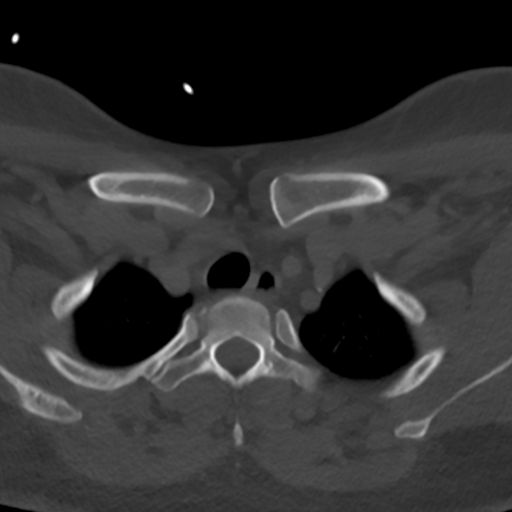
[im 50/125  bone]
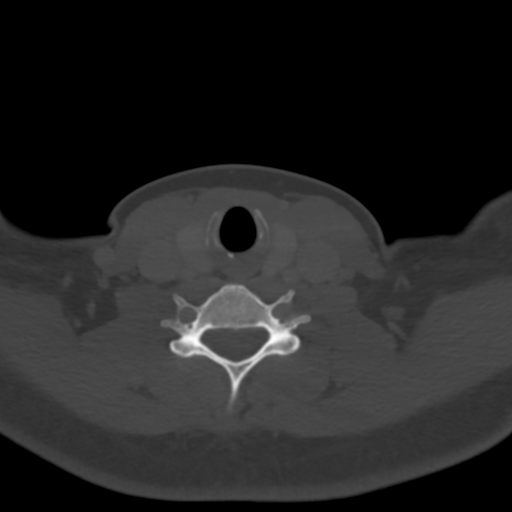
[im 75/125  bone]
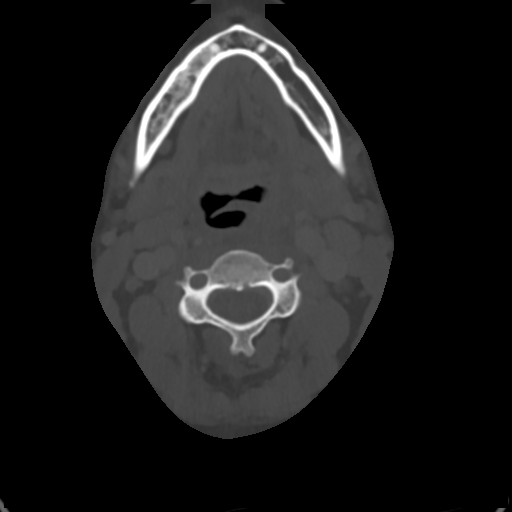
[im 100/125  bone]
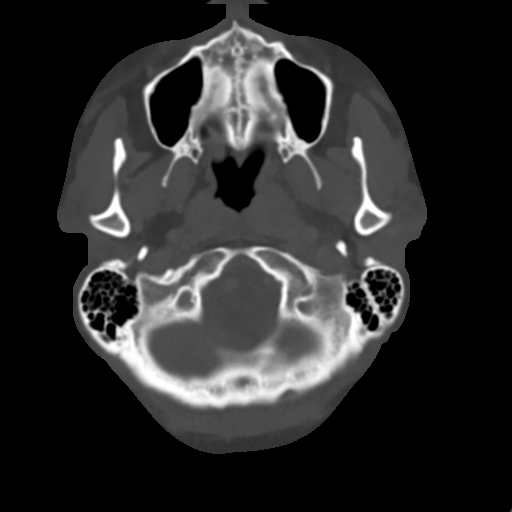

[12 of 33 positions shown; findings below may reference images not displayed]

FINDINGS: Pharynx and larynx: Thickened tonsils with bilateral adenoid rim
enhancing collections measuring 9 mm on the right and 8 mm on the
left. There is a prominent degree of retropharyngeal submucosal
edema that tracks into the supraglottic larynx. There is
retropharyngeal low-density that has some mass effect on the
posterior pharyngeal wall and is somewhat discrete. No rim enhancing
retropharyngeal collection.

Salivary glands: No inflammation, mass, or stone.

Thyroid: Normal.

Lymph nodes: Enlarged bilateral jugular lymph nodes without
cavitation.

Vascular: Negative

Limited intracranial: Negative

Visualized orbits: Negative

Mastoids and visualized paranasal sinuses: Clear

Skeleton: No evidence spinal infection.

Upper chest: Negative
IMPRESSION: 1. Tonsillitis with peritonsillar abscesses measuring 9 mm on the
right and 8 mm on the left. Associated submucosal edema continues
into the supraglottic larynx. Retropharyngeal edema versus phlegmon.
2. Cervical adenitis
